# Patient Record
Sex: Female | Born: 1975 | Hispanic: Yes | Marital: Married | Smoking: Never smoker
Health system: Southern US, Community
[De-identification: ages and names within clinical notes are randomized; demographics above are authoritative.]

## PROBLEM LIST (undated history)

## (undated) DIAGNOSIS — E781 Pure hyperglyceridemia: Secondary | ICD-10-CM

## (undated) HISTORY — DX: Pure hyperglyceridemia: E78.1

## (undated) HISTORY — PX: DILATION AND CURETTAGE OF UTERUS: SHX78

---

## 1997-07-23 DIAGNOSIS — Z8619 Personal history of other infectious and parasitic diseases: Secondary | ICD-10-CM

## 1997-07-23 HISTORY — DX: Personal history of other infectious and parasitic diseases: Z86.19

## 2005-01-20 ENCOUNTER — Emergency Department: Payer: Self-pay | Admitting: Emergency Medicine

## 2006-10-17 ENCOUNTER — Emergency Department: Payer: Self-pay | Admitting: Unknown Physician Specialty

## 2008-03-03 IMAGING — US US OB < 14 WEEKS - US OB TV
1 series · 17 of 28 positions shown · non-contrast
Comparison: none

REASON FOR EXAM: spotting
COMMENTS:

[Series 1: us ob < 14 weeks - us ob tv · 17 of 60 slices shown]
[im 1/60]
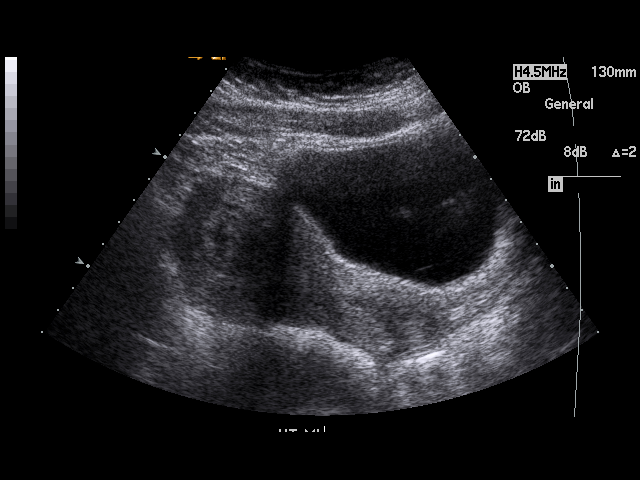
[im 5/60]
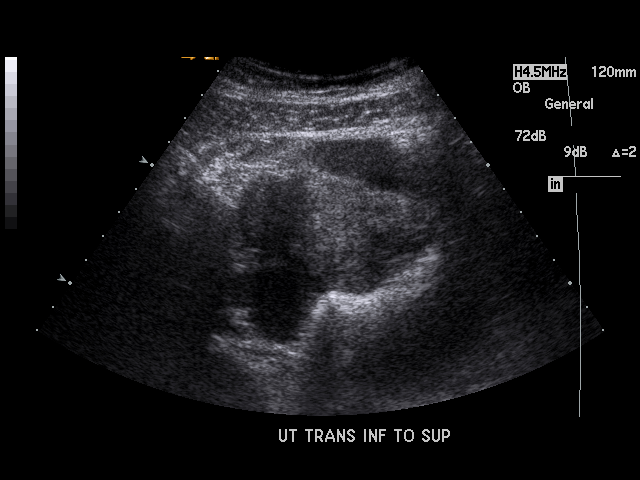
[im 9/60]
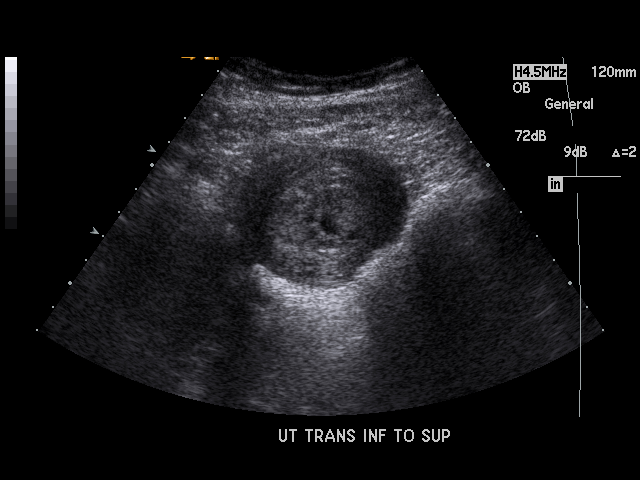
[im 11/60]
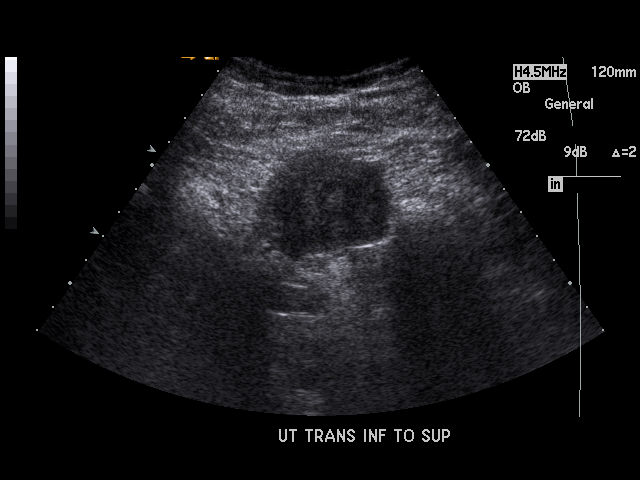
[im 16/60]
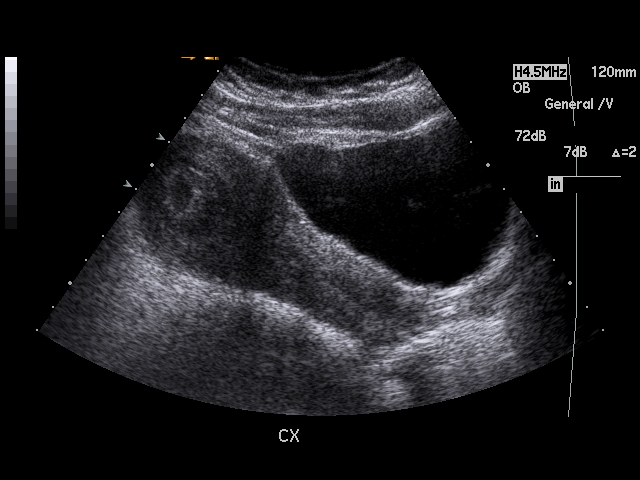
[im 20/60]
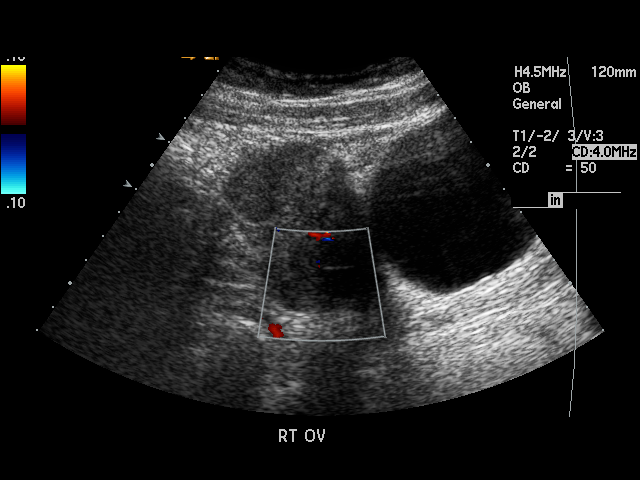
[im 22/60]
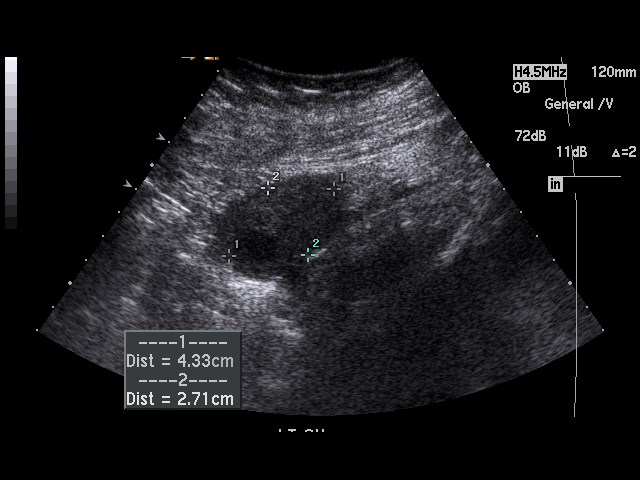
[im 27/60]
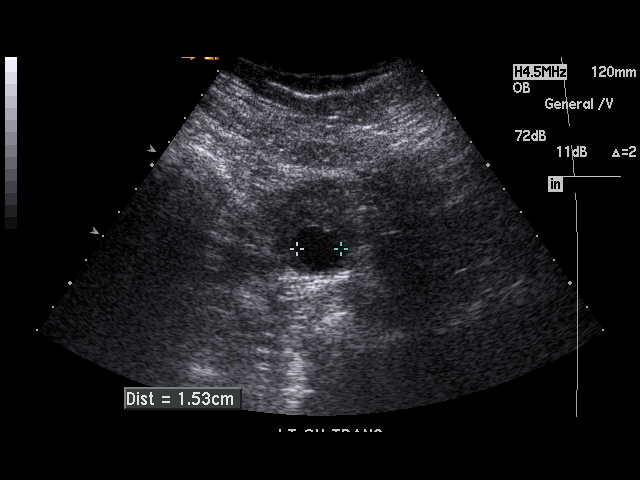
[im 31/60]
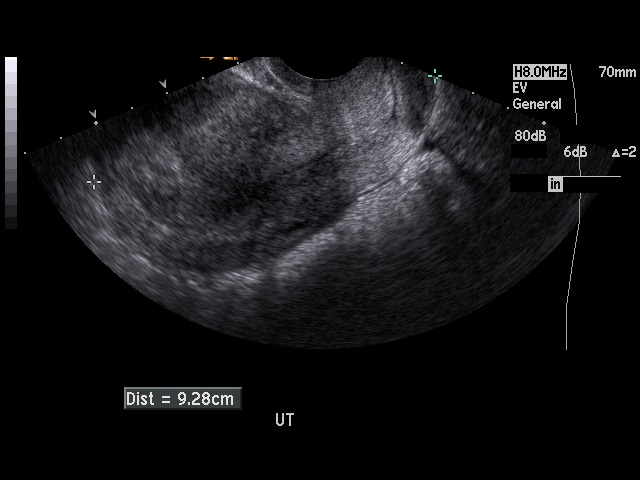
[im 33/60]
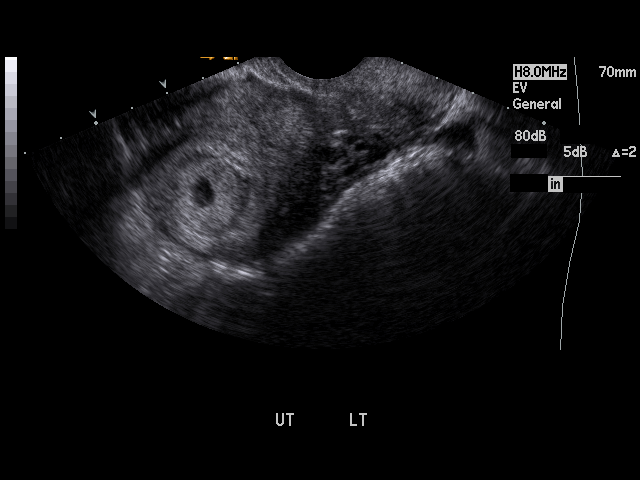
[im 38/60]
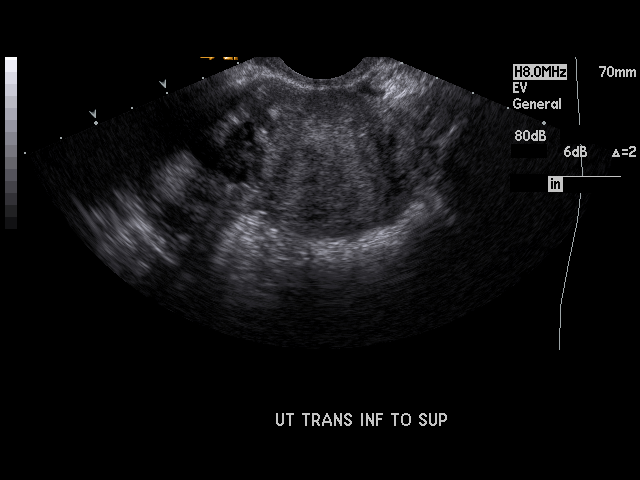
[im 40/60]
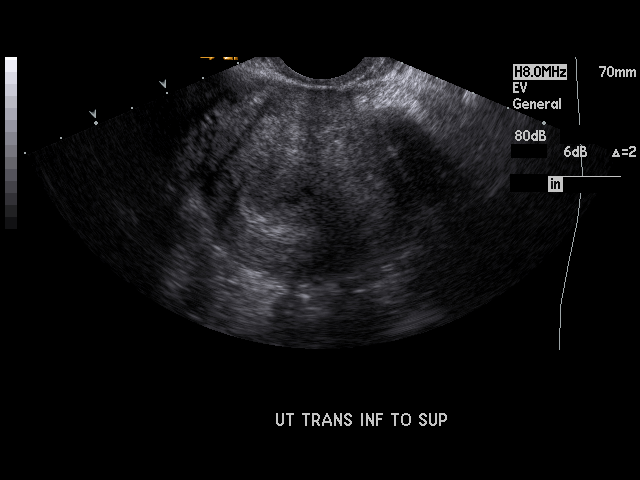
[im 44/60]
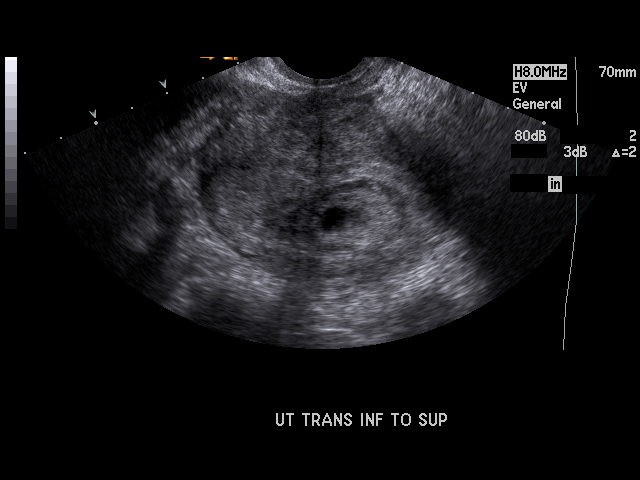
[im 49/60]
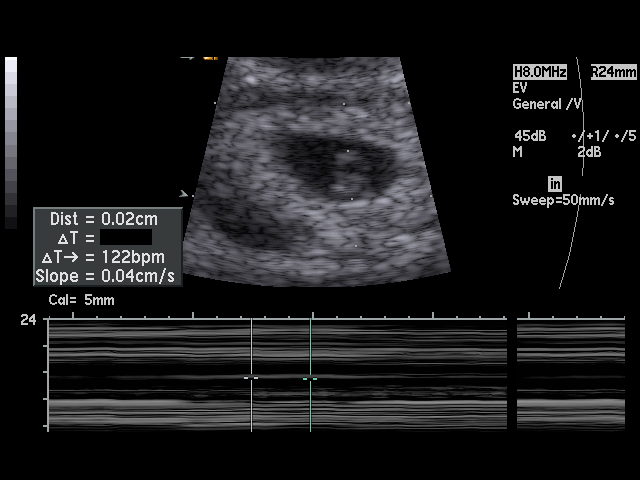
[im 51/60]
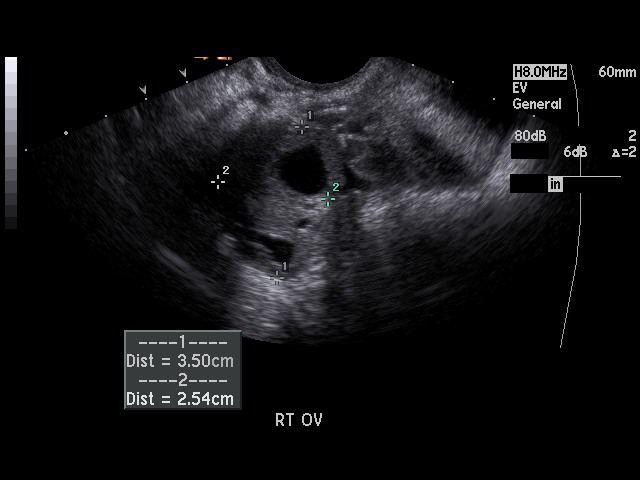
[im 55/60]
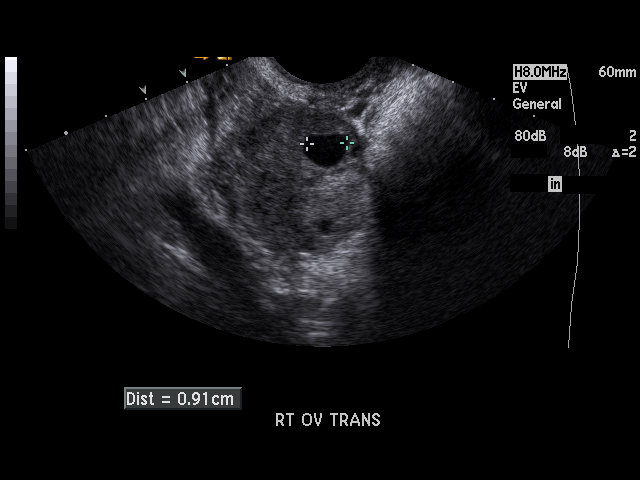
[im 60/60]
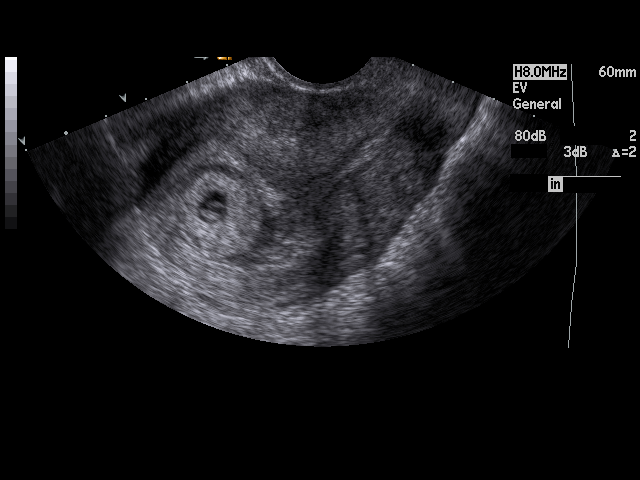

[17 of 28 positions shown; findings below may reference images not displayed]

PROCEDURE:     US  - US OB LESS THAN 14 WEEKS  - October 17, 2006 [DATE]

RESULT:     There is observed a living intrauterine gestation. Embryo heart
rate was monitored at 122 beats per minute. The crown-rump length measures
4.0 mm which corresponds to an estimated gestational age of 6 weeks 1 day.
The gestational sac measures 1.68 cm which corresponds to 6 weeks 0 days.
There is a 1.27 cm simple cyst of the RIGHT ovary and a 1.78 cm simple cyst
of LEFT ovary. No other adnexal masses are seen. There is no free fluid in
the pelvis. The visualized portion of the urinary bladder is normal in
appearance.
IMPRESSION: 1. Transabdominal and endovaginal ultrasound was performed.
2. There is living intrauterine gestation of approximately 6 weeks 1 day
gestational age.
3. Incidental note is made of bilateral ovarian cysts.

## 2009-11-17 ENCOUNTER — Emergency Department: Payer: Self-pay | Admitting: Emergency Medicine

## 2011-07-14 ENCOUNTER — Emergency Department: Payer: Self-pay | Admitting: Emergency Medicine

## 2015-03-02 ENCOUNTER — Other Ambulatory Visit: Payer: Self-pay | Admitting: Family Medicine

## 2015-03-02 DIAGNOSIS — R1032 Left lower quadrant pain: Secondary | ICD-10-CM

## 2015-03-09 ENCOUNTER — Ambulatory Visit: Admission: RE | Admit: 2015-03-09 | Payer: Self-pay | Source: Ambulatory Visit

## 2021-10-03 DIAGNOSIS — Z0000 Encounter for general adult medical examination without abnormal findings: Secondary | ICD-10-CM | POA: Diagnosis not present

## 2021-10-03 DIAGNOSIS — K219 Gastro-esophageal reflux disease without esophagitis: Secondary | ICD-10-CM | POA: Diagnosis not present

## 2021-10-03 DIAGNOSIS — R110 Nausea: Secondary | ICD-10-CM | POA: Diagnosis not present

## 2021-10-30 DIAGNOSIS — R1032 Left lower quadrant pain: Secondary | ICD-10-CM | POA: Diagnosis not present

## 2021-10-30 DIAGNOSIS — R109 Unspecified abdominal pain: Secondary | ICD-10-CM | POA: Diagnosis not present

## 2021-10-30 DIAGNOSIS — Z0000 Encounter for general adult medical examination without abnormal findings: Secondary | ICD-10-CM | POA: Diagnosis not present

## 2021-10-31 ENCOUNTER — Other Ambulatory Visit: Payer: Self-pay | Admitting: Family Medicine

## 2021-10-31 DIAGNOSIS — Z1231 Encounter for screening mammogram for malignant neoplasm of breast: Secondary | ICD-10-CM

## 2021-12-14 ENCOUNTER — Telehealth: Payer: Self-pay

## 2021-12-14 DIAGNOSIS — Z1389 Encounter for screening for other disorder: Secondary | ICD-10-CM | POA: Diagnosis not present

## 2021-12-14 DIAGNOSIS — R739 Hyperglycemia, unspecified: Secondary | ICD-10-CM | POA: Diagnosis not present

## 2021-12-14 DIAGNOSIS — R1032 Left lower quadrant pain: Secondary | ICD-10-CM | POA: Diagnosis not present

## 2021-12-14 DIAGNOSIS — Z712 Person consulting for explanation of examination or test findings: Secondary | ICD-10-CM | POA: Diagnosis not present

## 2021-12-14 NOTE — Telephone Encounter (Signed)
Pt called with an Astronomer for Spanish to ask permission from the nurse about establishing care with Dr. Danise Mina. I made the pt aware that he is not accepting right now, but she still wants to ask because someone referred her to him. Please return a call when possible to let the patient know, thanks.  Callback number: [phone]

## 2021-12-15 ENCOUNTER — Ambulatory Visit
Admission: RE | Admit: 2021-12-15 | Discharge: 2021-12-15 | Disposition: A | Discharge status: Home / Self Care | Payer: BC Managed Care – PPO | Source: Ambulatory Visit | Attending: Family Medicine | Admitting: Family Medicine

## 2021-12-15 ENCOUNTER — Encounter: Payer: Self-pay | Admitting: Radiology

## 2021-12-15 DIAGNOSIS — Z1231 Encounter for screening mammogram for malignant neoplasm of breast: Secondary | ICD-10-CM | POA: Insufficient documentation

## 2021-12-15 NOTE — Telephone Encounter (Signed)
Ok to schedule. Thanks

## 2021-12-15 NOTE — Telephone Encounter (Signed)
Spoke with pt scheduling NP OV on 12/22/21 at 8:30.

## 2021-12-22 ENCOUNTER — Encounter: Payer: Self-pay | Admitting: Family Medicine

## 2021-12-22 ENCOUNTER — Ambulatory Visit: Payer: BC Managed Care – PPO | Admitting: Family Medicine

## 2021-12-22 VITALS — BP 118/82 | HR 74 | Temp 97.5°F | Ht 61.5 in | Wt 161.0 lb

## 2021-12-22 DIAGNOSIS — B351 Tinea unguium: Secondary | ICD-10-CM

## 2021-12-22 DIAGNOSIS — E282 Polycystic ovarian syndrome: Secondary | ICD-10-CM

## 2021-12-22 DIAGNOSIS — R202 Paresthesia of skin: Secondary | ICD-10-CM | POA: Diagnosis not present

## 2021-12-22 DIAGNOSIS — E663 Overweight: Secondary | ICD-10-CM

## 2021-12-22 DIAGNOSIS — R87618 Other abnormal cytological findings on specimens from cervix uteri: Secondary | ICD-10-CM

## 2021-12-22 DIAGNOSIS — E781 Pure hyperglyceridemia: Secondary | ICD-10-CM | POA: Diagnosis not present

## 2021-12-22 LAB — CBC WITH DIFFERENTIAL/PLATELET
Basophils Absolute: 0 10*3/uL (ref 0.0–0.1)
Basophils Relative: 0.2 % (ref 0.0–3.0)
Eosinophils Absolute: 0.1 10*3/uL (ref 0.0–0.7)
Eosinophils Relative: 1.1 % (ref 0.0–5.0)
HCT: 42.5 % (ref 36.0–46.0)
Hemoglobin: 14.1 g/dL (ref 12.0–15.0)
Lymphocytes Relative: 40.1 % (ref 12.0–46.0)
Lymphs Abs: 3.6 10*3/uL (ref 0.7–4.0)
MCHC: 33.1 g/dL (ref 30.0–36.0)
MCV: 84.8 fl (ref 78.0–100.0)
Monocytes Absolute: 0.5 10*3/uL (ref 0.1–1.0)
Monocytes Relative: 6 % (ref 3.0–12.0)
Neutro Abs: 4.7 10*3/uL (ref 1.4–7.7)
Neutrophils Relative %: 52.6 % (ref 43.0–77.0)
Platelets: 242 10*3/uL (ref 150.0–400.0)
RBC: 5.01 Mil/uL (ref 3.87–5.11)
RDW: 15.2 % (ref 11.5–15.5)
WBC: 9 10*3/uL (ref 4.0–10.5)

## 2021-12-22 LAB — COMPREHENSIVE METABOLIC PANEL
ALT: 23 U/L (ref 0–35)
AST: 15 U/L (ref 0–37)
Albumin: 4.1 g/dL (ref 3.5–5.2)
Alkaline Phosphatase: 90 U/L (ref 39–117)
BUN: 14 mg/dL (ref 6–23)
CO2: 25 mEq/L (ref 19–32)
Calcium: 9.1 mg/dL (ref 8.4–10.5)
Chloride: 104 mEq/L (ref 96–112)
Creatinine, Ser: 0.53 mg/dL (ref 0.40–1.20)
GFR: 112.58 mL/min (ref >60.00)
Glucose, Bld: 81 mg/dL (ref 70–99)
Potassium: 4.1 mEq/L (ref 3.5–5.1)
Sodium: 137 mEq/L (ref 135–145)
Total Bilirubin: 0.5 mg/dL (ref 0.2–1.2)
Total Protein: 7.4 g/dL (ref 6.0–8.3)

## 2021-12-22 LAB — TSH: TSH: 0.78 u[IU]/mL (ref 0.35–5.50)

## 2021-12-22 LAB — VITAMIN B12: Vitamin B-12: 957 pg/mL — ABNORMAL HIGH (ref 211–911)

## 2021-12-22 LAB — LIPID PANEL
Cholesterol: 180 mg/dL (ref 0–200)
HDL: 39.7 mg/dL (ref >39.00)
LDL Cholesterol: 105 mg/dL — ABNORMAL HIGH (ref 0–99)
NonHDL: 140.38
Total CHOL/HDL Ratio: 5
Triglycerides: 178 mg/dL — ABNORMAL HIGH (ref 0.0–149.0)
VLDL: 35.6 mg/dL (ref 0.0–40.0)

## 2021-12-22 LAB — MAGNESIUM: Magnesium: 2.2 mg/dL (ref 1.5–2.5)

## 2021-12-22 MED ORDER — VITAMIN B-12 1000 MCG PO TABS: 1000.0000 ug | ORAL_TABLET | Freq: Every day | ORAL | Status: AC

## 2021-12-22 MED ORDER — VITAMIN D3 25 MCG (1000 UT) PO CAPS: 1.0000 | ORAL_CAPSULE | Freq: Every day | ORAL | Status: AC

## 2021-12-22 MED ORDER — MAGNESIUM 500 MG PO CAPS: 2.0000 | ORAL_CAPSULE | Freq: Every day | ORAL | 0 refills | Status: AC

## 2021-12-22 NOTE — Progress Notes (Signed)
Patient ID: Kristine Myers, female    DOB: 06-12-1976, 46 y.o.   MRN: [id number]  This visit was conducted in person.  BP 118/82   Pulse 74   Temp (!) 97.5 F (36.4 C) (Temporal)   Ht 5' 1.5" (1.562 m)   Wt 161 lb (73 kg)   SpO2 100%   BMI 29.93 kg/m    CC: new pt to establish care  Subjective:   HPI: Kristine Myers is a 46 y.o. female presenting on 12/22/2021 for New Patient (Initial Visit)   Recent mammogram 11/2021 reviewed - Birads1 @ [facility].  From [place].  She is friends with my pt Anne Ng.   Occasional tingling/numbness to hands, predominantly to L hand.  H/o hypertriglyceridemia previously treated with med but not recently.  Significant dietary changes over the past month. She's been taking vit B12 1058mg daily x 2 months as well as vitamin D with Mg  H/o dark discoloration to toenails. Wonders if fungal infection.   [facility]. Pap normal. H/o polycystic ovaries  Caffeine: drinking more matcha tea with almond milk and stevia  Lives with nephews Occ: SManufacturing engineer(sewing) Activity: starting to walk around house Diet: recent increase in fish and chicken, good vegetables, good water      Relevant past medical, surgical, family and social history reviewed and updated as indicated. Interim medical history since our last visit reviewed. Allergies and medications reviewed and updated. No outpatient medications prior to visit.   No facility-administered medications prior to visit.     Per HPI unless specifically indicated in ROS section below Review of Systems  Objective:  BP 118/82   Pulse 74   Temp (!) 97.5 F (36.4 C) (Temporal)   Ht 5' 1.5" (1.562 m)   Wt 161 lb (73 kg)   SpO2 100%   BMI 29.93 kg/m   Wt Readings from Last 3 Encounters:  12/22/21 161 lb (73 kg)      Physical Exam Vitals and nursing note reviewed.  Constitutional:      Appearance:  Normal appearance. She is not ill-appearing.  Cardiovascular:     Rate and Rhythm: Normal rate and regular rhythm.     Pulses: Normal pulses.     Heart sounds: Normal heart sounds. No murmur heard. Pulmonary:     Effort: Pulmonary effort is normal. No respiratory distress.     Breath sounds: Normal breath sounds. No wheezing, rhonchi or rales.  Musculoskeletal:        General: Normal range of motion.     Right lower leg: No edema.     Left lower leg: No edema.     Comments: 2+ DP bilaterally  Skin:    General: Skin is warm and dry.     Findings: No rash.     Comments: Thickened onychomycotic nail predominantly left second toe  Neurological:     Mental Status: She is alert.  Psychiatric:        Mood and Affect: Mood normal.        Behavior: Behavior normal.      No results found for this or any previous visit.     12/22/2021    9:40 AM  Depression screen PHQ 2/9  Decreased Interest 1  Down, Depressed, Hopeless 2  PHQ - 2 Score 3  Altered sleeping 2  Tired, decreased energy 2  Change in appetite 0  Feeling bad or failure about  yourself  0  Trouble concentrating 0  Moving slowly or fidgety/restless 0  Suicidal thoughts 0  PHQ-9 Score 7  Difficult doing work/chores Somewhat difficult      12/22/2021    9:40 AM  GAD 7 : Generalized Anxiety Score  Nervous, Anxious, on Edge 1  Control/stop worrying 0  Worry too much - different things 1  Trouble relaxing 1  Restless 0  Easily annoyed or irritable 0  Afraid - awful might happen 2  Total GAD 7 Score 5   Assessment & Plan:   Problem List Items Addressed This Visit     Hypertriglyceridemia - Primary    Update fasting lipid panel today in history of hypertriglyceridemia, previously on medication.       Relevant Orders   Lipid panel   Comprehensive metabolic panel   Paresthesia of both hands    No significant alcohol use No history of diabetes Check fasting sugar, vitamin B12, thyroid and CBC today. Predominant  paresthesias without numbness, not quite consistent with carpal tunnel syndrome.       Relevant Orders   Vitamin B12   Comprehensive metabolic panel   Magnesium   TSH   CBC with Differential/Platelet   Polycystic ovaries    Has been told h/o this.        Overweight with body mass index (BMI) 25.0-29.9    She notes approximately 12 pound weight loss in the last month-congratulated on healthy diet and lifestyle changes to date, patient feels this is sustainable.       Onychomycosis    Suspect this diagnosis-discussed OTC Fungi-Nail use.  Update with effect, consider oral antifungal if necessary.       Pap smear abnormality of cervix/human papillomavirus (HPV) positive    Endorses history of this, was told to continue yearly Pap smears.  Endorses recent Pap smear, will return when due for next.         Meds ordered this encounter  Medications   vitamin B-12 (CYANOCOBALAMIN) 1000 MCG tablet    Sig: Take 1 tablet (1,000 mcg total) by mouth daily.   Cholecalciferol (VITAMIN D3) 25 MCG (1000 UT) CAPS    Sig: Take 1 capsule (1,000 Units total) by mouth daily.    Dispense:  30 capsule   Magnesium 500 MG CAPS    Sig: Take 2 capsules (1,000 mg total) by mouth daily.    Refill:  0   Orders Placed This Encounter  Procedures   Vitamin B12   Lipid panel   Comprehensive metabolic panel   Magnesium   TSH   CBC with Differential/Platelet     Patient Instructions  Laboratorios hoy.  Compre FungiNail laquer de ua en la farmacia (sin receta).  Regresar en 6 meses para fisico, o cuando le toque su proximo fisico.   Follow up plan: Return in about 6 months (around 06/23/2022) for annual exam, prior fasting for blood work.  Ria Bush, MD

## 2021-12-22 NOTE — Assessment & Plan Note (Signed)
Has been told h/o this.

## 2021-12-22 NOTE — Assessment & Plan Note (Addendum)
Update fasting lipid panel today in history of hypertriglyceridemia, previously on medication.

## 2021-12-22 NOTE — Patient Instructions (Addendum)
Laboratorios hoy.  Compre FungiNail laquer de ua en la farmacia (sin receta).  Regresar en 6 meses para fisico, o cuando le toque su proximo fisico.

## 2021-12-22 NOTE — Assessment & Plan Note (Signed)
Endorses history of this, was told to continue yearly Pap smears.  Endorses recent Pap smear, will return when due for next.

## 2021-12-22 NOTE — Assessment & Plan Note (Addendum)
She notes approximately 12 pound weight loss in the last month-congratulated on healthy diet and lifestyle changes to date, patient feels this is sustainable.

## 2021-12-22 NOTE — Assessment & Plan Note (Signed)
No significant alcohol use No history of diabetes Check fasting sugar, vitamin B12, thyroid and CBC today. Predominant paresthesias without numbness, not quite consistent with carpal tunnel syndrome.

## 2021-12-22 NOTE — Assessment & Plan Note (Signed)
Suspect this diagnosis-discussed OTC Fungi-Nail use.  Update with effect, consider oral antifungal if necessary.

## 2022-01-24 ENCOUNTER — Encounter: Payer: Self-pay | Admitting: Obstetrics

## 2022-02-21 ENCOUNTER — Encounter: Payer: Self-pay | Admitting: Obstetrics

## 2022-02-28 DIAGNOSIS — N83202 Unspecified ovarian cyst, left side: Secondary | ICD-10-CM | POA: Diagnosis not present

## 2022-02-28 DIAGNOSIS — R1032 Left lower quadrant pain: Secondary | ICD-10-CM | POA: Diagnosis not present

## 2022-02-28 DIAGNOSIS — N83291 Other ovarian cyst, right side: Secondary | ICD-10-CM | POA: Diagnosis not present

## 2022-02-28 DIAGNOSIS — N83292 Other ovarian cyst, left side: Secondary | ICD-10-CM | POA: Diagnosis not present

## 2022-03-09 ENCOUNTER — Telehealth: Payer: Self-pay | Admitting: Family Medicine

## 2022-03-09 ENCOUNTER — Encounter: Payer: Self-pay | Admitting: Family Medicine

## 2022-03-09 DIAGNOSIS — N83202 Unspecified ovarian cyst, left side: Secondary | ICD-10-CM | POA: Insufficient documentation

## 2022-03-09 DIAGNOSIS — D259 Leiomyoma of uterus, unspecified: Secondary | ICD-10-CM

## 2022-03-09 NOTE — Telephone Encounter (Signed)
Pt rtn call.  I notified her Dr. Darnell Level received [place] report and that an MRI was recommended.  Pt verbalizes understanding and agrees to imaging.  States she has OV with OB/GYN on 04/12/22 with Dr. Amalia Hailey at [facility](?).    Also, pt states she's not having any pain right now but still wanted to schedule OV with Dr. Darnell Level to discuss [place] results.  Schedule OV on 03/13/22 at 8:00.

## 2022-03-09 NOTE — Telephone Encounter (Signed)
This patient was recently seen at [facility] ER for LLQ pain. Plz call for update on symptoms. She also had L ovarian cyst found on pelvic US - rec was f/u MRI and GYN evaluation.  Does she have GYN who will follow this or would she like [place] to schedule [place]?  Would also offer OV if ongoing LLQ pain.

## 2022-03-09 NOTE — Telephone Encounter (Addendum)
Attempted to contact pt.  Vm box full.  Need to get update on pt, get answer to Dr. Synthia Innocent question and relay his message.

## 2022-03-10 DIAGNOSIS — E119 Type 2 diabetes mellitus without complications: Secondary | ICD-10-CM | POA: Diagnosis not present

## 2022-03-10 DIAGNOSIS — R7309 Other abnormal glucose: Secondary | ICD-10-CM | POA: Diagnosis not present

## 2022-03-10 DIAGNOSIS — Z712 Person consulting for explanation of examination or test findings: Secondary | ICD-10-CM | POA: Diagnosis not present

## 2022-03-10 DIAGNOSIS — Z1389 Encounter for screening for other disorder: Secondary | ICD-10-CM | POA: Diagnosis not present

## 2022-03-10 DIAGNOSIS — E782 Mixed hyperlipidemia: Secondary | ICD-10-CM | POA: Diagnosis not present

## 2022-03-10 DIAGNOSIS — N83202 Unspecified ovarian cyst, left side: Secondary | ICD-10-CM | POA: Diagnosis not present

## 2022-03-13 ENCOUNTER — Ambulatory Visit: Payer: BC Managed Care – PPO | Admitting: Family Medicine

## 2022-03-13 ENCOUNTER — Encounter: Payer: Self-pay | Admitting: Family Medicine

## 2022-03-13 VITALS — BP 120/76 | HR 89 | Temp 97.8°F | Ht 61.5 in | Wt 154.4 lb

## 2022-03-13 DIAGNOSIS — D259 Leiomyoma of uterus, unspecified: Secondary | ICD-10-CM | POA: Diagnosis not present

## 2022-03-13 DIAGNOSIS — E282 Polycystic ovarian syndrome: Secondary | ICD-10-CM

## 2022-03-13 DIAGNOSIS — N83202 Unspecified ovarian cyst, left side: Secondary | ICD-10-CM

## 2022-03-13 DIAGNOSIS — N970 Female infertility associated with anovulation: Secondary | ICD-10-CM

## 2022-03-13 NOTE — Patient Instructions (Addendum)
Haremos cita para resonancia magnetica pelvica.  Esperare evaluacion de ginecologo el mes entrate.  [phone]

## 2022-03-13 NOTE — Progress Notes (Signed)
Patient ID: Kristine Myers, female    DOB: [birth year], 46 y.o.   MRN: [id number]  This visit was conducted in person.  BP 120/76   Pulse 89   Temp 97.8 F (36.6 C) (Temporal)   Ht 5' 1.5" (1.562 m)   Wt 154 lb 6 oz (70 kg)   LMP 03/08/2022   SpO2 98%   BMI 28.70 kg/m    CC: follow up abnormal imaging done at [facility].  Subjective:   HPI: Kristine Myers is a 46 y.o. female presenting on 03/13/2022 for Results (Has questions about recent ovarian cyst dx. )   Recent visit to [facility] imaging department for pelvic ultrasound for left lower quadrant abdominal pain (unclear who ordered this). Pelvic ultrasound showed left ovarian cyst, recommended follow-up with MRI and gynecology evaluation.  She does have follow-up scheduled with Dr. Amalia Hailey at [facility] in [place] for September 21.   Pelvic US 02/28/2022 IMPRESSION:  --Thick-walled cyst in the left ovary measuring 2.6 cm with heterogeneous low-level internal echoes. Mildly irregular ball noted. While this may present a hemorrhagic or corpus luteal cyst, appearance is mildly atypical. O-RADS ultrasound category: 3: Low Risk (1  - <10%)  Recommended Management: Followup MRI and Gynecology consult  --Enlarged fibroid uterus as detailed above.    Last month she didn't have a period. Very irregular.  Left lower abdominal pain has since resolved but she notes she gets LLQ pain with radiation down left leg every time she has a cycle.   She states she has been told she has history of polycystic ovaries.  LMP 03/08/2022.   Menarche age 42 yo.  Saw [facility] for fertility treatments, unsuccessful s/p 5 miscarriages. Told she couldn't ovulate.      Relevant past medical, surgical, family and social history reviewed and updated as indicated. Interim medical history since our last visit reviewed. Allergies and medications reviewed and updated. Outpatient Medications Prior to Visit  Medication Sig Dispense Refill   Cholecalciferol  (VITAMIN D3) 25 MCG (1000 UT) CAPS Take 1 capsule (1,000 Units total) by mouth daily. 30 capsule    Magnesium 500 MG CAPS Take 2 capsules (1,000 mg total) by mouth daily.  0   vitamin B-12 (CYANOCOBALAMIN) 1000 MCG tablet Take 1 tablet (1,000 mcg total) by mouth daily.     No facility-administered medications prior to visit.     Per HPI unless specifically indicated in ROS section below Review of Systems  Objective:  BP 120/76   Pulse 89   Temp 97.8 F (36.6 C) (Temporal)   Ht 5' 1.5" (1.562 m)   Wt 154 lb 6 oz (70 kg)   LMP 03/08/2022   SpO2 98%   BMI 28.70 kg/m   Wt Readings from Last 3 Encounters:  03/13/22 154 lb 6 oz (70 kg)  12/22/21 161 lb (73 kg)      Physical Exam Vitals and nursing note reviewed.  Constitutional:      Appearance: Normal appearance. She is not ill-appearing.  HENT:     Head: Normocephalic and atraumatic.     Mouth/Throat:     Mouth: Mucous membranes are moist.     Pharynx: Oropharynx is clear. No oropharyngeal exudate or posterior oropharyngeal erythema.  Eyes:     Extraocular Movements: Extraocular movements intact.     Conjunctiva/sclera: Conjunctivae normal.     Pupils: Pupils are equal, round, and reactive to light.  Cardiovascular:     Rate and Rhythm: Normal rate and regular rhythm.  Pulses: Normal pulses.     Heart sounds: Normal heart sounds. No murmur heard. Pulmonary:     Effort: Pulmonary effort is normal. No respiratory distress.     Breath sounds: Normal breath sounds. No wheezing, rhonchi or rales.  Abdominal:     General: Bowel sounds are normal. There is no distension.     Palpations: Abdomen is soft. There is no mass.     Tenderness: There is abdominal tenderness (mild discomfort) in the left lower quadrant. There is no right CVA tenderness, left CVA tenderness, guarding or rebound. Negative signs include Murphy's sign.     Hernia: No hernia is present.    Musculoskeletal:     Right lower leg: No edema.     Left  lower leg: No edema.  Skin:    General: Skin is warm and dry.     Findings: No rash.  Neurological:     Mental Status: She is alert.  Psychiatric:        Mood and Affect: Mood normal.        Behavior: Behavior normal.       Results for orders placed or performed in visit on 12/22/21  Vitamin B12  Result Value Ref Range   Vitamin B-12 957 (H) 211 - 911 pg/mL  Lipid panel  Result Value Ref Range   Cholesterol 180 0 - 200 mg/dL   Triglycerides 178.0 (H) 0.0 - 149.0 mg/dL   HDL 39.70 >39.00 mg/dL   VLDL 35.6 0.0 - 40.0 mg/dL   LDL Cholesterol 105 (H) 0 - 99 mg/dL   Total CHOL/HDL Ratio 5    NonHDL 140.38   Comprehensive metabolic panel  Result Value Ref Range   Sodium 137 135 - 145 mEq/L   Potassium 4.1 3.5 - 5.1 mEq/L   Chloride 104 96 - 112 mEq/L   CO2 25 19 - 32 mEq/L   Glucose, Bld 81 70 - 99 mg/dL   BUN 14 6 - 23 mg/dL   Creatinine, Ser 0.53 0.40 - 1.20 mg/dL   Total Bilirubin 0.5 0.2 - 1.2 mg/dL   Alkaline Phosphatase 90 39 - 117 U/L   AST 15 0 - 37 U/L   ALT 23 0 - 35 U/L   Total Protein 7.4 6.0 - 8.3 g/dL   Albumin 4.1 3.5 - 5.2 g/dL   GFR 112.58 >60.00 mL/min   Calcium 9.1 8.4 - 10.5 mg/dL  Magnesium  Result Value Ref Range   Magnesium 2.2 1.5 - 2.5 mg/dL  TSH  Result Value Ref Range   TSH 0.78 0.35 - 5.50 uIU/mL  CBC with Differential/Platelet  Result Value Ref Range   WBC 9.0 4.0 - 10.5 K/uL   RBC 5.01 3.87 - 5.11 Mil/uL   Hemoglobin 14.1 12.0 - 15.0 g/dL   HCT 42.5 36.0 - 46.0 %   MCV 84.8 78.0 - 100.0 fl   MCHC 33.1 30.0 - 36.0 g/dL   RDW 15.2 11.5 - 15.5 %   Platelets 242.0 150.0 - 400.0 K/uL   Neutrophils Relative % 52.6 43.0 - 77.0 %   Lymphocytes Relative 40.1 12.0 - 46.0 %   Monocytes Relative 6.0 3.0 - 12.0 %   Eosinophils Relative 1.1 0.0 - 5.0 %   Basophils Relative 0.2 0.0 - 3.0 %   Neutro Abs 4.7 1.4 - 7.7 K/uL   Lymphs Abs 3.6 0.7 - 4.0 K/uL   Monocytes Absolute 0.5 0.1 - 1.0 K/uL   Eosinophils Absolute 0.1 0.0 - 0.7 K/uL  Basophils Absolute 0.0 0.0 - 0.1 K/uL    Assessment & Plan:   Problem List Items Addressed This Visit     Polycystic ovaries    ?h/o this.       Ovarian cyst, left - Primary    Reviewed recent pelvic US done at [facility] - atypical left ovarian cyst rec MRI and GYN eval.  Pt endorses h/o polycystic ovaries, but not consistent with pelvic US findings.  Given atypical ovarian cystic mass LLQ and ongoing LLQ pain with cycles, will order pelvic MRI for further evaluation.  She will also keep upcoming appointment with gynecology next month.       Relevant Orders   MR Pelvis W Wo Contrast   Fibroid uterus    To f/u with GYN regarding this.  She endorses irregular periods but denies h/o menorrhagia.       Infertility associated with anovulation    Longstanding history of this, underwent treatments remotely unsuccessfully (s/p 5 miscarriages). Told had difficulty ovulating.         No orders of the defined types were placed in this encounter.  Orders Placed This Encounter  Procedures   MR Pelvis W Wo Contrast    Standing Status:   Future    Standing Expiration Date:   03/14/2023    Order Specific Question:   If indicated for the ordered procedure, I authorize the administration of contrast media per Radiology protocol    Answer:   Yes    Order Specific Question:   What is the patient's sedation requirement?    Answer:   No Sedation    Order Specific Question:   Does the patient have a pacemaker or implanted devices?    Answer:   No    Order Specific Question:   Preferred imaging location?    Answer:   [facility] (table limit-350lbs)     Patient Instructions  Haremos cita para resonancia magnetica pelvica.  Esperare evaluacion de ginecologo el mes entrate.  [phone]  Follow up plan: Return if symptoms worsen or fail to improve.  Ria Bush, MD

## 2022-03-13 NOTE — Assessment & Plan Note (Signed)
To f/u with GYN regarding this.  She endorses irregular periods but denies h/o menorrhagia.

## 2022-03-13 NOTE — Assessment & Plan Note (Addendum)
Reviewed recent pelvic US done at [facility] imaging - atypical left ovarian cyst rec MRI and GYN eval.  Pt endorses h/o polycystic ovaries, but not consistent with pelvic US findings.  Given atypical ovarian cystic mass LLQ and ongoing LLQ pain with cycles, will order pelvic MRI for further evaluation.  She will also keep upcoming appointment with gynecology next month.

## 2022-03-13 NOTE — Assessment & Plan Note (Addendum)
Longstanding history of this, underwent treatments remotely unsuccessfully (s/p 5 miscarriages). Told had difficulty ovulating.

## 2022-03-13 NOTE — Assessment & Plan Note (Signed)
?  h/o this.

## 2022-03-21 ENCOUNTER — Ambulatory Visit
Admission: RE | Admit: 2022-03-21 | Discharge: 2022-03-21 | Disposition: A | Discharge status: Home / Self Care | Payer: BC Managed Care – PPO | Source: Ambulatory Visit | Attending: Family Medicine | Admitting: Family Medicine

## 2022-03-21 DIAGNOSIS — D259 Leiomyoma of uterus, unspecified: Secondary | ICD-10-CM | POA: Diagnosis not present

## 2022-03-21 DIAGNOSIS — N3289 Other specified disorders of bladder: Secondary | ICD-10-CM | POA: Diagnosis not present

## 2022-03-21 DIAGNOSIS — N83202 Unspecified ovarian cyst, left side: Secondary | ICD-10-CM | POA: Diagnosis not present

## 2022-03-21 MED ORDER — GADOBUTROL 1 MMOL/ML IV SOLN
7.0000 mL | Freq: Once | INTRAVENOUS | Status: AC | PRN
Start: 2022-03-21 — End: 2022-03-21
  Administered 2022-03-21: 7 mL via INTRAVENOUS

## 2022-03-22 ENCOUNTER — Encounter: Payer: Self-pay | Admitting: Family Medicine

## 2022-03-22 NOTE — Telephone Encounter (Signed)
error 

## 2022-04-12 ENCOUNTER — Encounter: Payer: Self-pay | Admitting: Obstetrics and Gynecology

## 2022-04-12 ENCOUNTER — Ambulatory Visit: Payer: BC Managed Care – PPO | Admitting: Obstetrics and Gynecology

## 2022-04-12 VITALS — BP 116/82 | Ht 61.5 in | Wt 156.6 lb

## 2022-04-12 DIAGNOSIS — N926 Irregular menstruation, unspecified: Secondary | ICD-10-CM

## 2022-04-12 DIAGNOSIS — Z30011 Encounter for initial prescription of contraceptive pills: Secondary | ICD-10-CM | POA: Diagnosis not present

## 2022-04-12 DIAGNOSIS — L680 Hirsutism: Secondary | ICD-10-CM

## 2022-04-12 DIAGNOSIS — D259 Leiomyoma of uterus, unspecified: Secondary | ICD-10-CM | POA: Diagnosis not present

## 2022-04-12 DIAGNOSIS — D219 Benign neoplasm of connective and other soft tissue, unspecified: Secondary | ICD-10-CM

## 2022-04-12 DIAGNOSIS — E282 Polycystic ovarian syndrome: Secondary | ICD-10-CM | POA: Diagnosis not present

## 2022-04-12 MED ORDER — LEVONORGEST-ETH ESTRAD 91-DAY 0.15-0.03 &0.01 MG PO TABS: 1.0000 | ORAL_TABLET | Freq: Every day | ORAL | 1 refills | Status: DC

## 2022-04-12 NOTE — Progress Notes (Signed)
Patient presents today to discuss recent ultrasound showing fibroids. She states no history of fibroids but always having irregular cycles. She states no cycle control. Patient states no additional concerns.

## 2022-04-12 NOTE — Progress Notes (Signed)
HPI:      [name] is a 46 y.o. [id number] who LMP was Patient's last menstrual period was 04/08/2022.  Subjective:   She presents today because she recently had an MRI which showed a pedunculated uterine fibroid approximately 4 cm in size.  This also showed multiple cystic follicles in the ovary.  She reports that she has irregular cycles often skipping periods.  Usually her menses last 5 days and she has some cramping in her leg but is not too bad.  That is not primarily an issue. She does state that her cycles have been very irregular for most of her life and she has had several miscarriages.  When I mentioned that she could have PCO she states that she was previously told this by a different doctor but they told her there was nothing to be done about it. She also complains of facial hair growth.    Hx: The following portions of the patient's history were reviewed and updated as appropriate:             She  has a past medical history of History of chicken pox (1999) and Hypertriglyceridemia. She does not have any pertinent problems on file. She  has a past surgical history that includes Dilation and curettage of uterus. Her family history includes Cancer in her maternal grandmother; Diabetes (age of onset: 82) in her father; High Cholesterol in her father and mother. She  reports that she has never smoked. She has never been exposed to tobacco smoke. She has never used smokeless tobacco. She reports that she does not currently use alcohol. She reports that she does not use drugs. She has a current medication list which includes the following prescription(s): levonorgestrel-ethinyl estradiol, magnesium, cyanocobalamin, and vitamin d3. She has No Known Allergies.       Review of Systems:  Review of Systems  Constitutional: Denied constitutional symptoms, night sweats, recent illness, fatigue, fever, insomnia and weight loss.  Eyes: Denied eye symptoms, eye pain, photophobia,  vision change and visual disturbance.  Ears/Nose/Throat/Neck: Denied ear, nose, throat or neck symptoms, hearing loss, nasal discharge, sinus congestion and sore throat.  Cardiovascular: Denied cardiovascular symptoms, arrhythmia, chest pain/pressure, edema, exercise intolerance, orthopnea and palpitations.  Respiratory: Denied pulmonary symptoms, asthma, pleuritic pain, productive sputum, cough, dyspnea and wheezing.  Gastrointestinal: Denied, gastro-esophageal reflux, melena, nausea and vomiting.  Genitourinary: See HPI for additional information.  Musculoskeletal: Denied musculoskeletal symptoms, stiffness, swelling, muscle weakness and myalgia.  Dermatologic: Denied dermatology symptoms, rash and scar.  Neurologic: Denied neurology symptoms, dizziness, headache, neck pain and syncope.  Psychiatric: Denied psychiatric symptoms, anxiety and depression.  Endocrine: See HPI for additional information.   Meds:   Current Outpatient Medications on File Prior to Visit  Medication Sig Dispense Refill   Magnesium 500 MG CAPS Take 2 capsules (1,000 mg total) by mouth daily.  0   vitamin B-12 (CYANOCOBALAMIN) 1000 MCG tablet Take 1 tablet (1,000 mcg total) by mouth daily.     Cholecalciferol (VITAMIN D3) 25 MCG (1000 UT) CAPS Take 1 capsule (1,000 Units total) by mouth daily. (Patient not taking: Reported on 04/12/2022) 30 capsule    No current facility-administered medications on file prior to visit.      Objective:     Vitals:   04/12/22 1546  BP: 116/82   Filed Weights   04/12/22 1546  Weight: 156 lb 9.6 oz (71 kg)              Imaging  reviewed.          Assessment:    G5P0050 Patient Active Problem List   Diagnosis Date Noted   Infertility associated with anovulation 03/13/2022   Ovarian cyst, left 03/09/2022   Fibroid uterus 03/09/2022   Hypertriglyceridemia 12/22/2021   Paresthesia of both hands 12/22/2021   Polycystic ovaries 12/22/2021   Overweight with body mass  index (BMI) 25.0-29.9 12/22/2021   Onychomycosis 12/22/2021   Pap smear abnormality of cervix/human papillomavirus (HPV) positive 12/22/2021     1. Uterine leiomyoma, unspecified location   2. Irregular menstrual cycle   3. PCO (polycystic ovaries)   4. Initiation of OCP (BCP)   5. Fibroids   6. Hirsutism     Her fibroid is not really causing her much in the way of difficulty as far as pain or heavy bleeding.  I do not think it needs to be "managed".  I do believe PCO is an issue for her as her cycles have been irregular her whole life and she has hair growth.   Plan:            1.  Discussed PCO in detail.  Discussed OCP use for PCO.  Patient will start [place].  OCPs The risks /benefits of OCPs have been explained to the patient in detail.  Product literature has been given to her where appropriate.  I have instructed her in the use of OCPs.  I have explained to the patient that OCPs are not as effective for birth control during the first month of use, and that another form of contraception should be used during this time.  Both first-day start and Sunday start have been explained.  The risks and benefits of each was discussed.  She has been made aware of  the fact that in rare circumstances, other medications may affect the efficacy of OCPs.  I have answered all of her questions, and I believe that she has an understanding of the effectiveness and use of OCPs.  2.  Fibroid discussed in detail.  Nothing further to do at this time. Orders No orders of the defined types were placed in this encounter.    Meds ordered this encounter  Medications   Levonorgestrel-Ethinyl Estradiol (AMETHIA) 0.15-0.03 &0.01 MG tablet    Sig: Take 1 tablet by mouth at bedtime.    Dispense:  84 tablet    Refill:  1      F/U  Return in about 4 months (around 08/12/2022). I spent 41 minutes involved in the care of this patient preparing to see the patient by obtaining and reviewing her medical history  (including labs, imaging tests and prior procedures), documenting clinical information in the electronic health record (EHR), counseling and coordinating care plans, writing and sending prescriptions, ordering tests or procedures and in direct communicating with the patient and medical staff discussing pertinent items from her history and physical exam.  Finis Bud, M.D. 04/12/2022 4:44 PM

## 2022-10-22 ENCOUNTER — Telehealth: Payer: Self-pay | Admitting: Obstetrics and Gynecology

## 2022-10-22 ENCOUNTER — Other Ambulatory Visit: Payer: Self-pay | Admitting: Obstetrics and Gynecology

## 2022-10-22 DIAGNOSIS — E282 Polycystic ovarian syndrome: Secondary | ICD-10-CM

## 2022-10-22 NOTE — Telephone Encounter (Signed)
Patient needs a follow-up with [facility] for a "med f/u" . I contacted the patient use the [employer] [place] ID [phone]. We have contacted the patient x2 Voicemail is full, no answer.

## 2023-01-27 ENCOUNTER — Other Ambulatory Visit: Payer: Self-pay | Admitting: Obstetrics and Gynecology

## 2023-01-27 DIAGNOSIS — E282 Polycystic ovarian syndrome: Secondary | ICD-10-CM

## 2023-05-02 IMAGING — MG MM DIGITAL SCREENING BILAT W/ TOMO AND CAD
6 of 10 series · 6 of 30 positions shown · non-contrast
Comparison: Previous exam(s).

CLINICAL DATA: Screening.

EXAM:
DIGITAL SCREENING BILATERAL MAMMOGRAM WITH TOMOSYNTHESIS AND CAD
TECHNIQUE: Bilateral screening digital craniocaudal and mediolateral oblique
mammograms were obtained. Bilateral screening digital breast
tomosynthesis was performed. The images were evaluated with
computer-aided detection.

[L CC synth-2D]
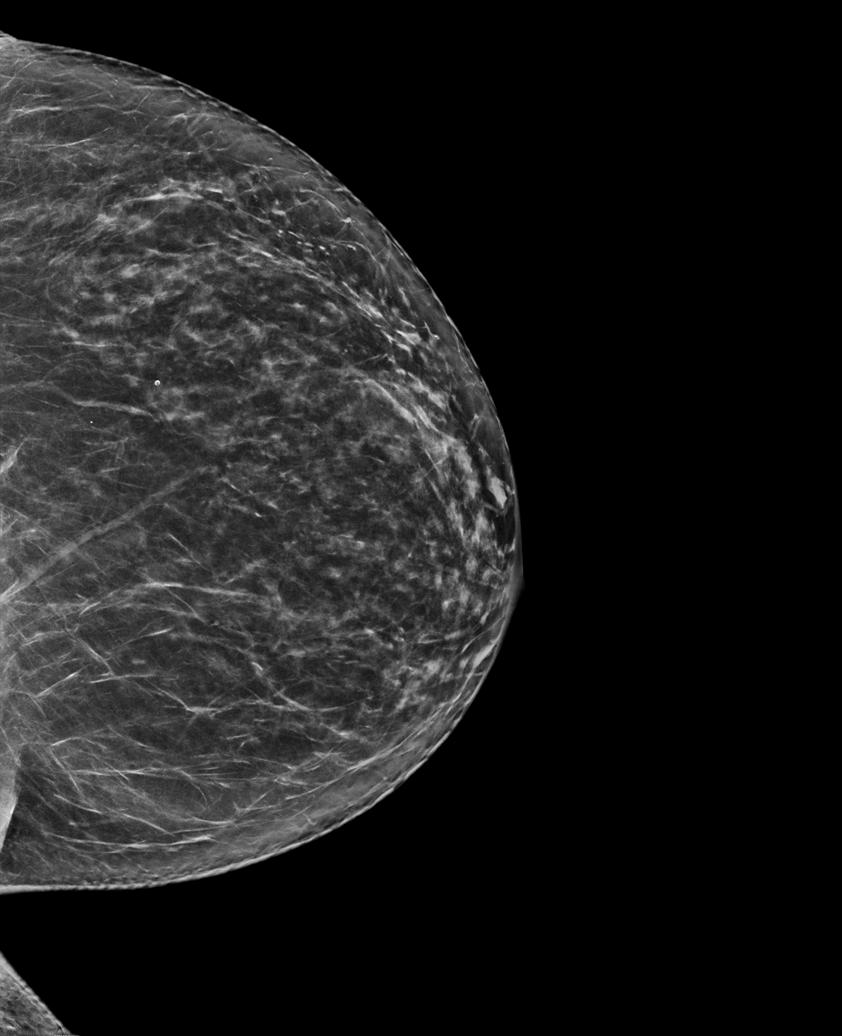

[R CC synth-2D]
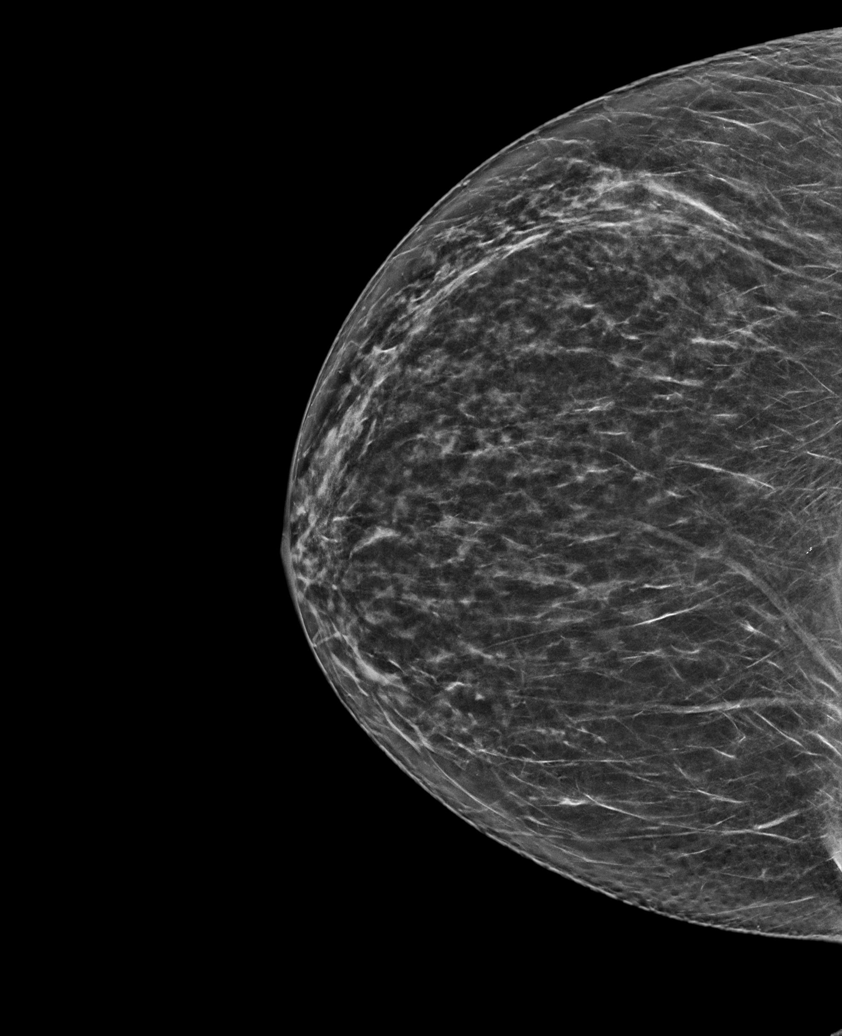

[L MLO synth-2D (1 of 2)]
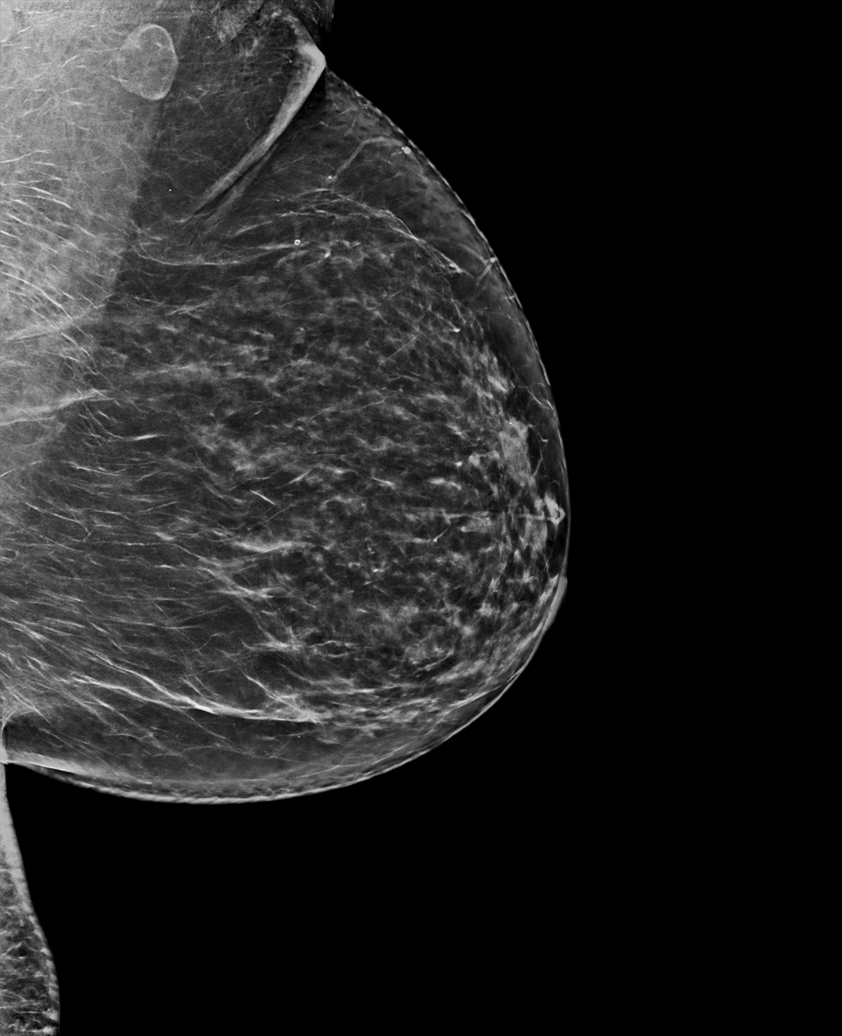

[R MLO synth-2D]
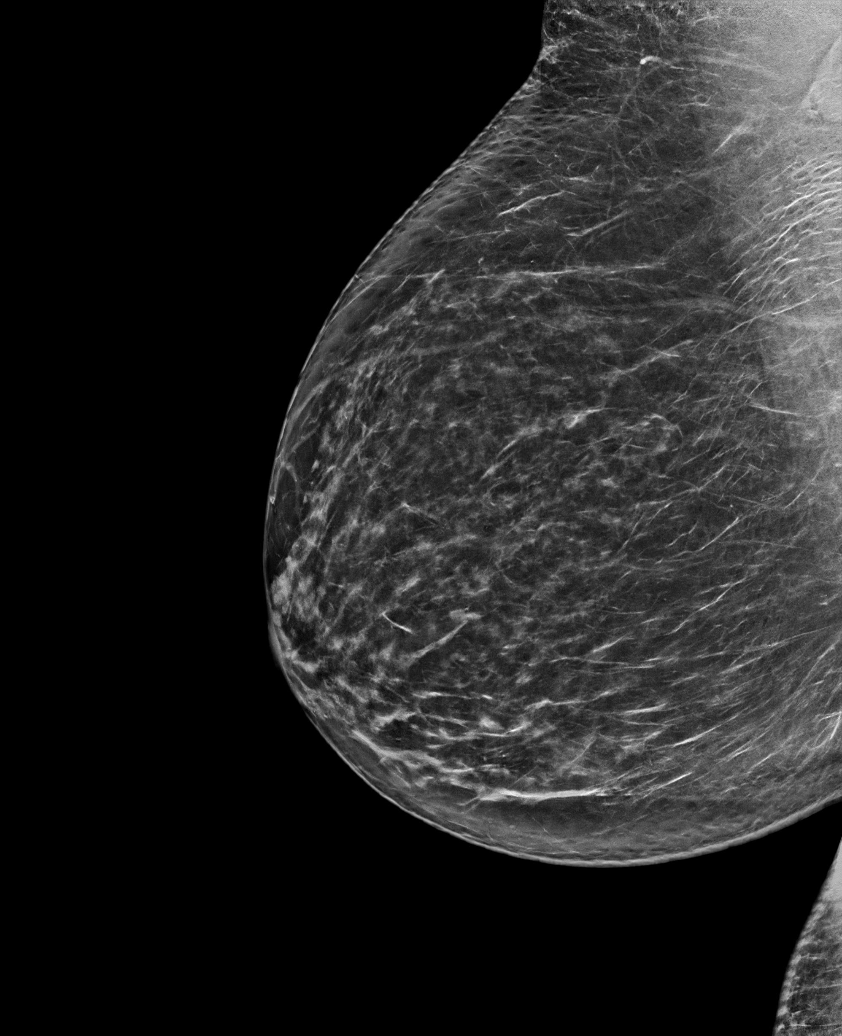

[L MLO synth-2D (2 of 2)]
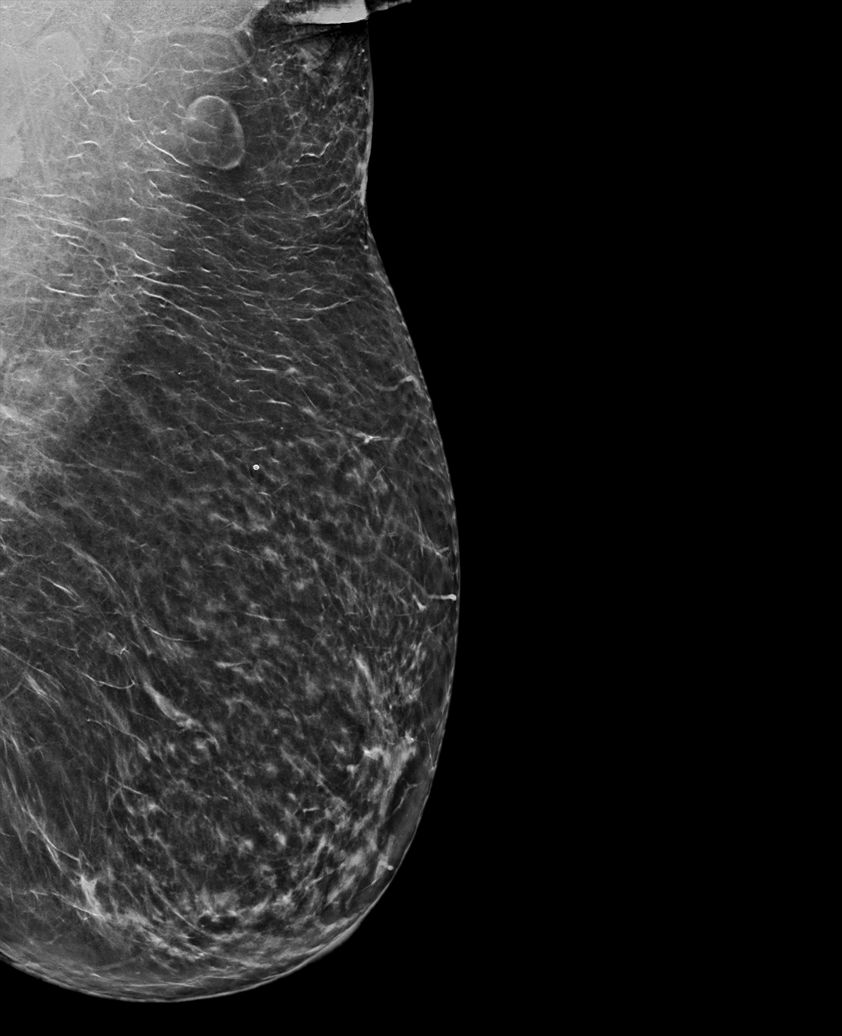

[L MLO tomo · tomo slice 41/82.0]
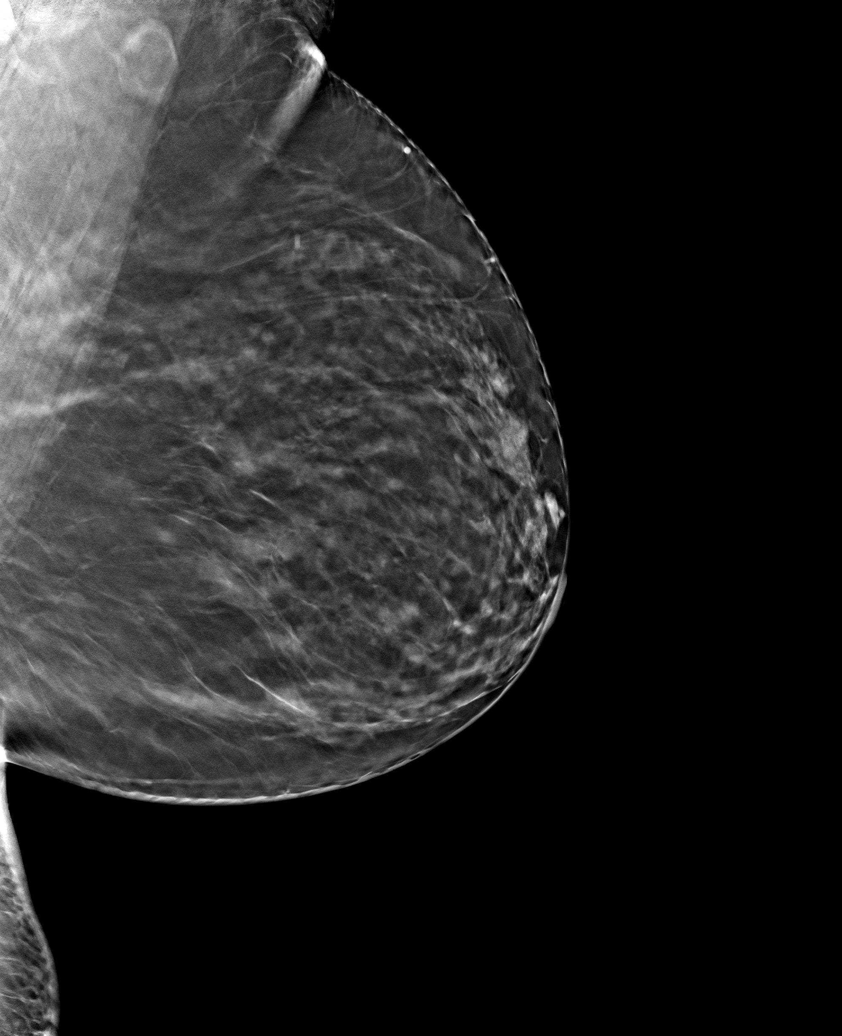

[6 of 30 positions shown; findings below may reference images not displayed]

ACR Breast Density Category b: There are scattered areas of
fibroglandular density.
FINDINGS: There are no findings suspicious for malignancy.
IMPRESSION: No mammographic evidence of malignancy. A result letter of this
screening mammogram will be mailed directly to the patient.

RECOMMENDATION:
Screening mammogram in one year. (Code:51-O-LD2)

BI-RADS CATEGORY  1: Negative.

## 2023-05-10 ENCOUNTER — Other Ambulatory Visit: Payer: Self-pay | Admitting: Obstetrics and Gynecology

## 2023-05-10 DIAGNOSIS — E282 Polycystic ovarian syndrome: Secondary | ICD-10-CM
